# Patient Record
Sex: Male | Born: 1970 | Race: White | Hispanic: No | Marital: Married
Health system: Southern US, Community
[De-identification: ages and names within clinical notes are randomized; demographics above are authoritative.]

---

## 2010-04-15 ENCOUNTER — Emergency Department (HOSPITAL_COMMUNITY): Admission: EM | Admit: 2010-04-15 | Discharge: 2010-04-15 | Payer: Self-pay | Admitting: Emergency Medicine
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
## 2020-12-15 ENCOUNTER — Ambulatory Visit: Payer: 59 | Admitting: Psychology

## 2020-12-22 ENCOUNTER — Ambulatory Visit (INDEPENDENT_AMBULATORY_CARE_PROVIDER_SITE_OTHER): Payer: 59 | Admitting: Psychology

## 2020-12-22 DIAGNOSIS — F4321 Adjustment disorder with depressed mood: Secondary | ICD-10-CM | POA: Diagnosis not present

## 2020-12-22 DIAGNOSIS — F411 Generalized anxiety disorder: Secondary | ICD-10-CM | POA: Diagnosis not present

## 2021-01-05 ENCOUNTER — Ambulatory Visit (INDEPENDENT_AMBULATORY_CARE_PROVIDER_SITE_OTHER): Payer: 59 | Admitting: Psychology

## 2021-01-05 DIAGNOSIS — F4321 Adjustment disorder with depressed mood: Secondary | ICD-10-CM

## 2021-01-19 ENCOUNTER — Ambulatory Visit: Payer: 59 | Admitting: Psychology

## 2021-01-26 ENCOUNTER — Ambulatory Visit (INDEPENDENT_AMBULATORY_CARE_PROVIDER_SITE_OTHER): Payer: 59 | Admitting: Psychology

## 2021-01-26 DIAGNOSIS — F411 Generalized anxiety disorder: Secondary | ICD-10-CM | POA: Diagnosis not present

## 2021-01-26 DIAGNOSIS — F4321 Adjustment disorder with depressed mood: Secondary | ICD-10-CM | POA: Diagnosis not present

## 2021-03-02 ENCOUNTER — Ambulatory Visit (INDEPENDENT_AMBULATORY_CARE_PROVIDER_SITE_OTHER): Payer: 59 | Admitting: Psychology

## 2021-03-02 DIAGNOSIS — F4321 Adjustment disorder with depressed mood: Secondary | ICD-10-CM | POA: Diagnosis not present

## 2021-03-02 DIAGNOSIS — F411 Generalized anxiety disorder: Secondary | ICD-10-CM | POA: Diagnosis not present

## 2021-04-06 ENCOUNTER — Ambulatory Visit (INDEPENDENT_AMBULATORY_CARE_PROVIDER_SITE_OTHER): Payer: 59 | Admitting: Psychology

## 2021-04-06 DIAGNOSIS — F4321 Adjustment disorder with depressed mood: Secondary | ICD-10-CM | POA: Diagnosis not present

## 2021-04-06 DIAGNOSIS — F411 Generalized anxiety disorder: Secondary | ICD-10-CM | POA: Diagnosis not present

## 2021-04-20 ENCOUNTER — Ambulatory Visit (INDEPENDENT_AMBULATORY_CARE_PROVIDER_SITE_OTHER): Payer: 59 | Admitting: Psychology

## 2021-04-20 DIAGNOSIS — F4321 Adjustment disorder with depressed mood: Secondary | ICD-10-CM

## 2021-04-20 DIAGNOSIS — F411 Generalized anxiety disorder: Secondary | ICD-10-CM

## 2021-05-24 ENCOUNTER — Ambulatory Visit: Payer: 59 | Admitting: Psychology

## 2021-06-02 ENCOUNTER — Ambulatory Visit (INDEPENDENT_AMBULATORY_CARE_PROVIDER_SITE_OTHER): Payer: 59 | Admitting: Psychology

## 2021-06-02 DIAGNOSIS — F411 Generalized anxiety disorder: Secondary | ICD-10-CM | POA: Diagnosis not present

## 2021-06-02 DIAGNOSIS — F4321 Adjustment disorder with depressed mood: Secondary | ICD-10-CM

## 2021-06-22 ENCOUNTER — Ambulatory Visit (INDEPENDENT_AMBULATORY_CARE_PROVIDER_SITE_OTHER): Payer: 59 | Admitting: Psychology

## 2021-06-22 DIAGNOSIS — F4321 Adjustment disorder with depressed mood: Secondary | ICD-10-CM | POA: Diagnosis not present

## 2021-06-22 DIAGNOSIS — F411 Generalized anxiety disorder: Secondary | ICD-10-CM

## 2021-08-04 ENCOUNTER — Ambulatory Visit (INDEPENDENT_AMBULATORY_CARE_PROVIDER_SITE_OTHER): Payer: 59 | Admitting: Psychology

## 2021-08-04 DIAGNOSIS — F4321 Adjustment disorder with depressed mood: Secondary | ICD-10-CM | POA: Diagnosis not present

## 2021-08-04 DIAGNOSIS — F411 Generalized anxiety disorder: Secondary | ICD-10-CM | POA: Diagnosis not present

## 2021-08-24 ENCOUNTER — Ambulatory Visit (INDEPENDENT_AMBULATORY_CARE_PROVIDER_SITE_OTHER): Payer: 59 | Admitting: Psychology

## 2021-08-24 DIAGNOSIS — F411 Generalized anxiety disorder: Secondary | ICD-10-CM | POA: Diagnosis not present

## 2021-08-24 DIAGNOSIS — F4321 Adjustment disorder with depressed mood: Secondary | ICD-10-CM | POA: Diagnosis not present

## 2021-09-21 ENCOUNTER — Ambulatory Visit (INDEPENDENT_AMBULATORY_CARE_PROVIDER_SITE_OTHER): Payer: 59 | Admitting: Psychology

## 2021-09-21 DIAGNOSIS — F411 Generalized anxiety disorder: Secondary | ICD-10-CM | POA: Diagnosis not present

## 2021-09-21 DIAGNOSIS — F4321 Adjustment disorder with depressed mood: Secondary | ICD-10-CM | POA: Diagnosis not present

## 2021-10-26 ENCOUNTER — Ambulatory Visit (INDEPENDENT_AMBULATORY_CARE_PROVIDER_SITE_OTHER): Payer: 59 | Admitting: Psychology

## 2021-10-26 DIAGNOSIS — F411 Generalized anxiety disorder: Secondary | ICD-10-CM

## 2021-10-26 DIAGNOSIS — F4321 Adjustment disorder with depressed mood: Secondary | ICD-10-CM

## 2021-11-23 ENCOUNTER — Ambulatory Visit (INDEPENDENT_AMBULATORY_CARE_PROVIDER_SITE_OTHER): Payer: 59 | Admitting: Psychology

## 2021-11-23 DIAGNOSIS — F411 Generalized anxiety disorder: Secondary | ICD-10-CM

## 2021-11-23 DIAGNOSIS — F4321 Adjustment disorder with depressed mood: Secondary | ICD-10-CM | POA: Diagnosis not present

## 2021-11-23 NOTE — Progress Notes (Signed)
[facility] Counselor/Therapist Progress Note  Patient ID: Brandon Collier, MRN: [id number],    Date: 11/23/2021  Time Spent: 50 mins  Treatment Type: Individual Therapy  Reported Symptoms: Pt presents for session today via webex video, due to the virus outbreak.  Pt shares he is in his home with no one else present.  I shared with pt that I am in my office with no one else here either.    Mental Status Exam: Appearance:  Casual     Behavior: Appropriate  Motor: Normal  Speech/Language:  Clear and Coherent  Affect: Appropriate  Mood: normal  Thought process: normal  Thought content:   WNL  Sensory/Perceptual disturbances:   WNL  Orientation: oriented to person, place, and time/date  Attention: Good  Concentration: Good  Memory: WNL  Fund of knowledge:  Good  Insight:   Good  Judgment:  Good  Impulse Control: Good   Risk Assessment: Danger to Self:  No Self-injurious Behavior: No Danger to Others: No Duty to Warn:no Physical Aggression / Violence:No  Access to Firearms a concern: No  Gang Involvement:No   Subjective: Pt shares that their Thanksgiving was "really nice and lots of fun and laughter.  It seems a lot more relaxed.  We went to [facility] to see my family and had a good time."  Juanetta Gosling was at Thanksgiving in [place] and he enjoyed spending time with her and seeing her pleasant interactions with the family.  He was not able to talk with her privately to tell her that he loves her and wants the best for her in her life.  Pt shares that they are trying to get Josie to tell them what she wants to receive for Christmas.  Mitzi's aunt passed away last week at 25 yo and she went home for the funeral and pt stayed home with Josie.  Pt shares that Mitzi's mom and dad are still living, as are his parents, and they feel fortunate about that.  Pt also shares that Mitzi's sister-in-law did have her procedures for breast cancer and is recovering well.  Pt shares that he  is scheduled to return to the office on T, W, Th each week, beginning on 12/13/21.  "I am not looking forward to it because I am just not as productive there as I am at home."  Pt is hoping that the company rethinks the perspective and will allow then to stay home more, once the time gets here.  Talked some with pt about their fortune to have both his and Mitzi's parents still living; he again reminds me of his dad's health challenges and that is hard for him to experience.  Pt shares that he has been reading more frequently at night before he tries to go to sleep; this has lead to him sleeping better at night.  Pt shares that he also continues to go to the gym regularly and is working on cardio and some weightlifting.  Pt shares he had his first two injections of Skyrizi to help him with his joints (psoriatic arthritis) and his psoriasis and he is happy about the results already.  He is not excited about having to give himself the injections moving forward.  He was happy with how easy the process was for him; he did not even need the nurse to come back for the second injection.  Pt also shares that last week he had a couple of drinks before dinner on a couple of different days.  Mitzi  pointed it out to him and he was able to recognize it and acknowledge it and make different choices moving forward.  Congratulated pt on his openness and willingness to look objectively at his choices and to learn from choices that are not best for him.  He is proud of how is able to do this and to make "slight adjustments as needed."  Pt has also been reflecting on his behavior choices over the years and has been talking with Mitzi about the way he processes his anger in the past.  He feels like he is continuing to learn and choose differently at this stage of his life and this is gratifying for pt.  Pt is able to tie his anxiety to his overuse of alcohol in the past and anger, etc.  Pt is aware of how his mood can impact the family;  the better he is feeling, the more laughter Mitzi and Josie experience; the happier the family is.  These ideas confirm for pt the weight of his positive decision making.       Interventions: Cognitive Behavioral Therapy  Diagnosis:Generalized anxiety disorder  Adjustment disorder with depressed mood  Plan: Treatment Plan Strengths/Abilities:  Intelligent, Intuitive, Willing to participate in therapy Treatment Preferences:  Outpatient Individual Therapy Statement of Needs:  Patient is to use CBT, mindfulness and coping skills to help manage and/or decrease symptoms associated with his diagnosis. Symptoms:  Anxious mood related to work issues, worry, controlled alcohol use. Problems Addressed:  Alcohol use, anxiety related to returning to office in January. Long Term Goals:  Pt to reduce overall level, frequency, and intensity of the feelings of depression/anxiety as evidenced by decreased irritability, negative self talk from 6 to 7 days/week to 0 to 1 days/week, per client report, for at least 3 consecutive months. Short Term Goals:  Pt to verbally express understanding of the relationship between feelings of depression/anxiety and their impact on thinking patterns and behaviors.  Pt to verbalize an understanding of the role that distorted thinking plays in creating fears, excessive worry, and ruminations. Target Date:  11/24/2022 Frequency:  Bi-weekly Modality:  Cognitive Behavioral Therapy Interventions by Therapist:  Therapist will use CBT, Mindfulness exercises, Coping skills and Referrals, as needed by client.  Karie Kirks, [facility]

## 2021-12-28 ENCOUNTER — Ambulatory Visit: Payer: 59 | Admitting: Psychology

## 2021-12-30 ENCOUNTER — Ambulatory Visit (INDEPENDENT_AMBULATORY_CARE_PROVIDER_SITE_OTHER): Payer: 59 | Admitting: Psychology

## 2021-12-30 DIAGNOSIS — F411 Generalized anxiety disorder: Secondary | ICD-10-CM | POA: Diagnosis not present

## 2021-12-30 DIAGNOSIS — F4321 Adjustment disorder with depressed mood: Secondary | ICD-10-CM
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
## 2022-02-10 ENCOUNTER — Ambulatory Visit (INDEPENDENT_AMBULATORY_CARE_PROVIDER_SITE_OTHER): Payer: 59 | Admitting: Psychology

## 2022-02-10 DIAGNOSIS — F411 Generalized anxiety disorder: Secondary | ICD-10-CM | POA: Diagnosis not present

## 2022-02-10 NOTE — Progress Notes (Signed)
[facility] Counselor/Therapist Progress Note ? ?Patient ID: Brandon Collier, MRN: [id number],   ? ?Date: 02/10/2022 ? ?Time Spent: 50 mins ? ?Treatment Type: Individual Therapy ? ?Reported Symptoms: Pt presents for session today via webex video, due to the virus outbreak.  Pt shares he is in his home with no one else present.  I shared with pt that I am in my office with no one else here either.   ? ?Mental Status Exam: ?Appearance:  Casual     ?Behavior: Appropriate  ?Motor: Normal  ?Speech/Language:  Clear and Coherent  ?Affect: Appropriate  ?Mood: normal  ?Thought process: normal  ?Thought content:   WNL  ?Sensory/Perceptual disturbances:   WNL  ?Orientation: oriented to person, place, and time/date  ?Attention: Good  ?Concentration: Good  ?Memory: WNL  ?Fund of knowledge:  Good  ?Insight:   Good  ?Judgment:  Good  ?Impulse Control: Good  ? ?Risk Assessment: ?Danger to Self:  No ?Self-injurious Behavior: No ?Danger to Others: No ?Duty to Warn:no ?Physical Aggression / Violence:No  ?Access to Firearms a concern: No  ?Gang Involvement:No  ? ?Subjective: Pt shares that, "I have been up and down with my health anxiety due to some ongoing physical symptoms; I have had more of these anxiety episodes, not big ones, but they have still been there.  I just got back from [place]; Sat to Wed.  This was the 6th trade show I have been to and this was the one I drank the least."  Pt has a colonoscopy scheduled for 3/13.  Pt shares that he and Mitzi did have a conversation about his [place] trip before it happened and pt felt that she was not as supportive of his efforts to manage his drinking; pt admits to getting defensive during that conversation.  Pt shares that Mitzi's grandfather was an alcoholic.  Talked with pt about the difficult pts have with alcohol issues to manage their use of alcohol.  Pt is able to acknowledge that he is the one who is responsible for his use of alcohol.  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  Juanetta Gosling is doing well in school; she is going to study abroad in [place] for 6 wks beginning in May.  Josie is doing well as well.  Encouraged pt to continue with his self care activities and we will meet in 6 wks for a follow up session. ? ?Interventions: Cognitive Behavioral Therapy ? ?Diagnosis:Generalized anxiety disorder ? ?Plan: Treatment Plan ?Strengths/Abilities:  Intelligent, Intuitive, Willing to participate in therapy ?Treatment Preferences:  Outpatient Individual Therapy ?Statement of Needs:  Patient is to use CBT, mindfulness and coping skills to help manage and/or decrease symptoms associated with his diagnosis. ?Symptoms:  Anxious mood related to work issues, worry, controlled alcohol use. ?Problems Addressed:  Alcohol use, anxiety related to returning to office in January. ?Long Term Goals:  Pt to reduce overall level, frequency, and intensity of the feelings of depression/anxiety as evidenced by decreased irritability, negative self talk from 6 to 7 days/week to 0 to 1 days/week, per client report, for at least 3 consecutive months. ?Short Term Goals:  Pt to verbally express understanding of the relationship between feelings of depression/anxiety and their impact on thinking patterns and behaviors.  Pt to verbalize an understanding of the role that distorted thinking plays in creating fears, excessive worry, and ruminations. ?Target Date:  11/24/2022 ?Frequency:  Bi-weekly ?Modality:  Cognitive Behavioral Therapy ?Interventions by Therapist:  Therapist will use CBT, Mindfulness exercises, Coping skills and Referrals, as needed by client. ? ?Karie Kirks, [facility] ? ? ? ? ? ? ? ? ? ? ? ? ? ? ?Karie Kirks, [facility] ?

## 2022-03-24 ENCOUNTER — Ambulatory Visit: Payer: 59 | Admitting: Psychology
